# Patient Record
Sex: Male | Born: 1970 | Race: White | Hispanic: No | Marital: Married | Smoking: Former smoker
Health system: Southern US, Community
[De-identification: ages and names within clinical notes are randomized; demographics above are authoritative.]

## PROBLEM LIST (undated history)

## (undated) DIAGNOSIS — G8929 Other chronic pain: Secondary | ICD-10-CM

## (undated) DIAGNOSIS — G35 Multiple sclerosis: Secondary | ICD-10-CM

## (undated) DIAGNOSIS — I10 Essential (primary) hypertension: Secondary | ICD-10-CM

## (undated) DIAGNOSIS — M549 Dorsalgia, unspecified: Secondary | ICD-10-CM

## (undated) HISTORY — DX: Multiple sclerosis: G35

## (undated) HISTORY — DX: Essential (primary) hypertension: I10

## (undated) HISTORY — PX: ARTHROSCOPIC REPAIR ACL: SUR80

## (undated) HISTORY — PX: TOTAL HIP ARTHROPLASTY: SHX124

---

## 2002-05-01 ENCOUNTER — Encounter: Admission: RE | Admit: 2002-05-01 | Discharge: 2002-05-30 | Payer: Self-pay | Admitting: Occupational Medicine

## 2002-06-24 ENCOUNTER — Encounter: Admission: RE | Admit: 2002-06-24 | Discharge: 2002-06-24 | Payer: Self-pay | Admitting: Occupational Medicine

## 2002-06-24 ENCOUNTER — Encounter: Payer: Self-pay | Admitting: Occupational Medicine

## 2003-11-25 ENCOUNTER — Ambulatory Visit (HOSPITAL_COMMUNITY): Admission: RE | Admit: 2003-11-25 | Discharge: 2003-11-25 | Payer: Self-pay | Admitting: Family Medicine

## 2010-02-14 ENCOUNTER — Ambulatory Visit: Payer: Self-pay | Admitting: Family Medicine

## 2010-02-14 DIAGNOSIS — H1030 Unspecified acute conjunctivitis, unspecified eye: Secondary | ICD-10-CM | POA: Insufficient documentation

## 2010-02-14 DIAGNOSIS — I10 Essential (primary) hypertension: Secondary | ICD-10-CM | POA: Insufficient documentation

## 2010-02-15 ENCOUNTER — Telehealth (INDEPENDENT_AMBULATORY_CARE_PROVIDER_SITE_OTHER): Payer: Self-pay | Admitting: *Deleted

## 2010-07-27 NOTE — Letter (Signed)
Summary: Out of Work  [facility]  [address]   [place], [place] [address]   Phone: [phone]  Fax: [phone]    February 14, 2010   Employee:  CHUCK Behrendt    To Whom It May Concern:   Mr. Rossell has a non-contagious form of conjunctivitis.    If you need additional information, please feel free to contact our office.         Sincerely,    Donna Christen MD

## 2010-07-27 NOTE — Assessment & Plan Note (Signed)
Summary: R eye pain x 2 dys rm 4   Vital Signs:  Patient Profile:   40 Years Old Male CC:      R eye pain x 2 dys Height:     71 inches Weight:      239 pounds O2 Sat:      100 % O2 treatment:    Room Air Temp:     98.5 degrees F oral Pulse rate:   58 / minute Pulse rhythm:   regular Resp:     16 per minute BP sitting:   130 / 85  (left arm) Cuff size:   large  Vitals Entered By: Areta Haber CMA (February 14, 2010 4:37 PM)              Vision Screening: Left eye w/o correction: 20 / 20 Right Eye w/o correction: 20 / 30 Both eyes w/o correction:  20/ 20  Color vision testing: normal      Vision Entered By: Areta Haber CMA (February 14, 2010 4:43 PM)    Current Allergies: No known allergies History of Present Illness Chief Complaint: R eye pain x 2 dys History of Present Illness:  Subjective:  Patient complains of pain and redness in his right eye for 2 days.  He believes that it could have been caused by his contact lens. No sinus congestion or earache.  Current Problems: CONJUNCTIVITIS, ACUTE, RIGHT (ICD-372.00) FAMILY HISTORY DIABETES 1ST DEGREE RELATIVE (ICD-V18.0) HYPERTENSION (ICD-401.9)   Current Meds DIOVAN HCT 160-12.5 MG TABS (VALSARTAN-HYDROCHLOROTHIAZIDE) 1 ta b by mouth once daily TOBRADEX 0.3-0.1 % SUSP (TOBRAMYCIN-DEXAMETHASONE) One drop OD Q1 to 2 hr for one day, then 1-2 gtts q4 to 6hr.  REVIEW OF SYSTEMS Constitutional Symptoms      Denies fever, chills, night sweats, weight loss, weight gain, and fatigue.  Eyes       Complains of eye pain.      Denies change in vision, eye discharge, glasses, contact lenses, and eye surgery.      Comments: R x 2 dys Ear/Nose/Throat/Mouth       Denies hearing loss/aids, change in hearing, ear pain, ear discharge, dizziness, frequent runny nose, frequent nose bleeds, sinus problems, sore throat, hoarseness, and tooth pain or bleeding.  Respiratory       Denies dry cough, productive cough, wheezing,  shortness of breath, asthma, bronchitis, and emphysema/COPD.  Cardiovascular       Denies murmurs, chest pain, and tires easily with exhertion.    Gastrointestinal       Denies stomach pain, nausea/vomiting, diarrhea, constipation, blood in bowel movements, and indigestion. Genitourniary       Denies painful urination, kidney stones, and loss of urinary control. Neurological       Denies paralysis, seizures, and fainting/blackouts. Musculoskeletal       Denies muscle pain, joint pain, joint stiffness, decreased range of motion, redness, swelling, muscle weakness, and gout.  Skin       Denies bruising, unusual mles/lumps or sores, and hair/skin or nail changes.  Psych       Denies mood changes, temper/anger issues, anxiety/stress, speech problems, depression, and sleep problems.  Past History:  Past Medical History: Hypertension  Past Surgical History: ACL  Family History: Family History Diabetes 1st degree relative Family History Hypertension  Social History: Married Never Smoked Alcohol use-yes Drug use-no Regular exercise-yes Smoking Status:  never Does Patient Exercise:  yes Drug Use:  no   Objective:  No acute distress  Eyes:  Pupils are  equal, round, and reactive to light and accomdation.  Extraocular movement is intact.   Left conjuctivae normal.  Right conjunctivae injected.  No photophobia.  No lid swelling or tenderness.  No discharge.  Lid eversion right eye reveals no foreign body.  Fluorescein to right eye reveals no uptake. Nose:  Normal septum.  Normal turbinates, mildly congested.   No sinus tenderness present.  Assessment New Problems: CONJUNCTIVITIS, ACUTE, RIGHT (ICD-372.00) FAMILY HISTORY DIABETES 1ST DEGREE RELATIVE (ICD-V18.0) HYPERTENSION (ICD-401.9)   Plan New Medications/Changes: TOBRADEX 0.3-0.1 % SUSP (TOBRAMYCIN-DEXAMETHASONE) One drop OD Q1 to 2 hr for one day, then 1-2 gtts q4 to 6hr.  #2.5cc x 0, 02/14/2010, Donna Christen MD  New  Orders: New Patient Level III [phone] Planning Comments:   Begin Tobradex susp.  Warm compresses to right eye. May take an anlagesic at bedtime.  Given a Water quality scientist patient information and instruction sheet on topic.  Follow-up with ophthalmologist if not improving in 48 hours or if symptoms worsen.   The patient and/or caregiver has been counseled thoroughly with regard to medications prescribed including dosage, schedule, interactions, rationale for use, and possible side effects and they verbalize understanding.  Diagnoses and expected course of recovery discussed and will return if not improved as expected or if the condition worsens. Patient and/or caregiver verbalized understanding.  Prescriptions: TOBRADEX 0.3-0.1 % SUSP (TOBRAMYCIN-DEXAMETHASONE) One drop OD Q1 to 2 hr for one day, then 1-2 gtts q4 to 6hr.  #2.5cc x 0   Entered and Authorized by:   Donna Christen MD   Signed by:   Donna Christen MD on 02/14/2010   Method used:   Print then Give to Patient   RxID:   [id number]   Orders Added: 1)  New Patient Level III [32951]

## 2010-07-27 NOTE — Progress Notes (Signed)
  Phone Note Outgoing Call Call back at [facility] Phone [phone]   Call placed by: Lajean Saver RN,  February 15, 2010 12:08 PM Call placed to: Patient Summary of Call: Call back: No answer. Message left on patient's cell phone with reason of call. Call back with concerns or questions.

## 2012-12-17 DIAGNOSIS — M545 Low back pain, unspecified: Secondary | ICD-10-CM | POA: Insufficient documentation

## 2014-07-29 ENCOUNTER — Encounter: Payer: Self-pay | Admitting: *Deleted

## 2014-07-29 ENCOUNTER — Ambulatory Visit (INDEPENDENT_AMBULATORY_CARE_PROVIDER_SITE_OTHER): Payer: Managed Care, Other (non HMO) | Admitting: Neurology

## 2014-07-29 ENCOUNTER — Encounter: Payer: Self-pay | Admitting: Neurology

## 2014-07-29 VITALS — BP 140/104 | HR 74 | Resp 16 | Ht 71.0 in | Wt 256.6 lb

## 2014-07-29 DIAGNOSIS — M791 Myalgia: Secondary | ICD-10-CM | POA: Diagnosis not present

## 2014-07-29 DIAGNOSIS — M5431 Sciatica, right side: Secondary | ICD-10-CM | POA: Diagnosis not present

## 2014-07-29 DIAGNOSIS — M7918 Myalgia, other site: Secondary | ICD-10-CM

## 2014-07-29 MED ORDER — TRAMADOL HCL 50 MG PO TABS: 50.0000 mg | ORAL_TABLET | Freq: Three times a day (TID) | ORAL | Status: DC | PRN

## 2014-07-29 MED ORDER — CYCLOBENZAPRINE HCL 5 MG PO TABS: 5.0000 mg | ORAL_TABLET | Freq: Three times a day (TID) | ORAL | Status: DC | PRN

## 2014-07-29 MED ORDER — ETODOLAC 400 MG PO TABS: 400.0000 mg | ORAL_TABLET | Freq: Two times a day (BID) | ORAL | Status: DC

## 2014-07-29 NOTE — Progress Notes (Signed)
[facility]  PATIENT: Jake Ware DOB: [birth year]  REFERRING CLINICIAN: Dustin Flock HISTORY FROM: Paitent REASON FOR VISIT: Low back and leg pain   HISTORICAL  CHIEF COMPLAINT:  Chief Complaint  Patient presents with  . Back Pain    Jake Ware is here for eval of lower back pain radiating into right buttock/hip, and occasionally pain in anterior right lower leg, right foot.  Onset 3-4 yrs. ago--when he caught a large patient who was falling.  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  Pain worse with lying/sitting.  Walking, back exercises help. Currently he takes Tizanidine, occaisonal Hydrocodone./fim    HISTORY OF PRESENT ILLNESS:   Jake Ware is a 45 year old man who first began noticing back pain and leg pain in 2013. 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 One day,  He recalls transferring a heavy man and having some difficulty. Later that night he had much worse pain.    Currently, the pain is most intense in the right buttock and hip region. Pain will go a little bit into the leg most of the time and go all the way to the foot when the pain is worse.Marland Kitchen He notes that the pain increases if he sits a while, especially if he is sitting on the right buttock.  Pain also increases if he Valsalva's for urination or defecation.  Pain may get a little bit better if he shifts around.   He notes that there is  The weakness on the right when he has some pain in the leg.    He denies any numbness.   He denies any bladder changes.  For the pain, he takes tizanidine adding twice a day. He also occasionally takes Percocet. He would take Tylenol during the daytime. In 2013, he received a steroid pack. He does not believe he has had any injections into the back or the hip region.    TENS has helped some  He recalls having an MRI of the lumbar spine in 2013 at [facility].. He was told that there were at L4-L5. He did a couple months of physical  therapy. He felt that pain improved at that time. Last year, he did another round of physical therapy and received a benefit again. He also has home exercises that he tries to do on a regular basis.    He is otherwise fairly healthy and has some hypertension.  REVIEW OF SYSTEMS:  Constitutional: No fevers, chills, sweats, or change in appetite Eyes: No visual changes, double vision, eye pain Ear, nose and throat: No hearing loss, ear pain, nasal congestion, sore throat Cardiovascular: No chest pain, palpitations Respiratory:  No shortness of breath at rest or with exertion.   No wheezes GastrointestinaI: No nausea, vomiting, diarrhea, abdominal pain, fecal incontinence Genitourinary:  No dysuria, urinary retention or frequency.  No nocturia. Musculoskeletal:  No neck pain, back pain Integumentary: No rash, pruritus, skin lesions Neurological: as above Psychiatric: No depression at this time.  No anxiety Endocrine: No palpitations, diaphoresis, change in appetite, change in weigh or increased thirst Hematologic/Lymphatic:  No anemia, purpura, petechiae. Allergic/Immunologic: No itchy/runny eyes, nasal congestion, recent allergic reactions, rashes  ALLERGIES: Not on File  HOME MEDICATIONS:  Current outpatient prescriptions:  .  oxyCODONE-acetaminophen (PERCOCET) 7.5-325 MG per tablet, Take 1 tablet by mouth every 8 (eight) hours as needed. for pain, Disp: , Rfl: 0 .  tiZANidine (ZANAFLEX) 4 MG tablet, , Disp: , Rfl:  1 .  valsartan-hydrochlorothiazide (DIOVAN-HCT) 160-12.5 MG per tablet, Take 1 tablet by mouth daily., Disp: , Rfl: 3   PAST MEDICAL HISTORY: Past Medical History  Diagnosis Date  . Hypertension   . Multiple sclerosis     PAST SURGICAL HISTORY: Past Surgical History  Procedure Laterality Date  . Arthroscopic repair acl Left     FAMILY HISTORY: Family History  Problem Relation Age of Onset  . Healthy Mother   . Congestive Heart Failure Father   . Diabetes  Father   . Pulmonary Hypertension Father   . Kidney failure Father     SOCIAL HISTORY:  History   Social History  . Marital Status: Married    Spouse Name: N/A    Number of Children: N/A  . Years of Education: N/A   Occupational History  . Not on file.   Social History Main Topics  . Smoking status: Former Games developermoker  . Smokeless tobacco: Not on file  . Alcohol Use: 0.0 oz/week    0 Not specified per week     Comment: occasional  . Drug Use: No  . Sexual Activity: Not on file   Other Topics Concern  . Not on file   Social History Narrative  . No narrative on file     PHYSICAL EXAM  Filed Vitals:   07/29/14 1333  BP: 140/104  Pulse: 74  Resp: 16  Height: 5\' 11"  (1.803 m)  Weight: 256 lb 9.6 oz (116.393 kg)    Body mass index is 35.8 kg/(m^2).   General: The patient is well-developed and well-nourished and in no acute distress  Neck: The neck is supple, no carotid bruits are noted.  The neck is nontender.  Skin: Extremities are without significant edema.  Musculoskeletal:  Tender over the right piriformis muscle.  Nontender over trochanteric bursa and lumbar paraspinal muscles  Neurologic Exam  Mental status: The patient is alert and oriented x 3 at the time of the examination. The patient has apparent normal recent and remote memory, with an apparently normal attention span and concentration ability.   Speech is normal.  Cranial nerves: Extraocular movements are full. Pupils are equal, round, and reactive to light and accomodation.  Visual fields are full.  Facial symmetry is present. There is good facial sensation to soft touch bilaterally.Facial strength is normal.  Trapezius and sternocleidomastoid strength is normal. No dysarthria is noted.  The tongue is midline, and the patient has symmetric elevation of the soft palate. No obvious hearing deficits are noted.  Motor:  Muscle bulk and tone are normal. Strength is  5 / 5 in all 4 extremities.   Sensory:  Sensory testing is intact to  Touch and  Vibration on all 4 extremities.  Coordination: Cerebellar testing reveals good finger-nose-finger and heel-to-shin bilaterally.  Gait and station: Station and gait are normal. Tandem gait is normal. Romberg is negative.   Reflexes: Deep tendon reflexes are symmetric and normal bilaterally. Plantar responses are normal.    DIAGNOSTIC DATA (LABS, IMAGING, TESTING) - I reviewed patient records, labs, notes, testing and imaging myself where available.     ASSESSMENT AND PLAN  Sciatica, right  Right buttock pain  In summary,Mr. Ophelia CharterYates is a 44 year old man with right buttock pain some radiation down the right leg. From his examination, I cannot tell if his symptoms are due to a sciatica from a piriformis syndrome or if they are due to a lumbar radiculopathy.for now, I will change some of his medications around. Specifically  we will switch the Brantley Fling to cyclobenzaprine and add etodolac and tramadol.  Additionally, I will do a trigger point injection into the right piriformis muscle with 80 mg of Depo Medrol in 5 mL Marcaine. He tolerated the procedure well.  If he is not better in a week, he will let us know. If that occurs, we will check an MRI of the lumbar spine to see if his pain might better be treated with epidural steroids or other procedures.  If he is better he will follow up as needed and he is advised to call if he has flareups in pain. I also gave him a note to return to work if he is feeling better.   Seymore Brodowski A. Epimenio Foot, MD, PhD 07/29/2014, 1:59 PM Certified in Neurology, Clinical Neurophysiology, Sleep Medicine, Pain Medicine and Neuroimaging  [facility] [address], [address] [place], [place] [address] [phone]

## 2014-08-04 ENCOUNTER — Telehealth: Payer: Self-pay | Admitting: Neurology

## 2014-08-04 NOTE — Telephone Encounter (Signed)
Spoke with Jake Ware and per RAS, rec. MRI L-spine without contrast since meds have not helped pain.  He sts. He was not able to get meds filled until Wednesday, then didn't start them until Saturday d/t virus.  He sts he would like to try them for a few more days and then let me know if he still needs mri.  I advised this is ok--he will call me back on Monday.  He also sts he is not able to return to work due to pain, requesting work note to return on Monday 08-11-14.  I advised will address this on Monday/fim

## 2014-08-04 NOTE — Telephone Encounter (Signed)
Patient is calling in regard to Rx Tramadol 50 mg, Flexeril 5 mg, and Lodine 400 mg.  Patient tried these last week but  symptoms are not better.  He is not sure if he has given these a fair amount of time since he was sick a couple of days last week. Should he continue to take these Rx or should the test you suggested be a better route.  Please call.

## 2014-08-07 ENCOUNTER — Encounter: Payer: Self-pay | Admitting: *Deleted

## 2014-08-07 ENCOUNTER — Telehealth: Payer: Self-pay | Admitting: Neurology

## 2014-08-07 NOTE — Telephone Encounter (Signed)
Patient is calling to get a note to return to work on Monday 08-11-14. Please fax note to [phone]. Thank you.

## 2014-08-07 NOTE — Telephone Encounter (Signed)
Work note allowing Jake Ware to return to work on Monday, 08-11-14 faxed to [phone] per his request/fim

## 2016-07-05 ENCOUNTER — Emergency Department (HOSPITAL_BASED_OUTPATIENT_CLINIC_OR_DEPARTMENT_OTHER)
Admission: EM | Admit: 2016-07-05 | Discharge: 2016-07-05 | Disposition: A | Discharge status: Home / Self Care | Payer: No Typology Code available for payment source | Attending: Emergency Medicine | Admitting: Emergency Medicine

## 2016-07-05 ENCOUNTER — Emergency Department (HOSPITAL_BASED_OUTPATIENT_CLINIC_OR_DEPARTMENT_OTHER): Payer: No Typology Code available for payment source

## 2016-07-05 ENCOUNTER — Encounter (HOSPITAL_BASED_OUTPATIENT_CLINIC_OR_DEPARTMENT_OTHER): Payer: Self-pay

## 2016-07-05 DIAGNOSIS — Y999 Unspecified external cause status: Secondary | ICD-10-CM | POA: Diagnosis not present

## 2016-07-05 DIAGNOSIS — R072 Precordial pain: Secondary | ICD-10-CM | POA: Insufficient documentation

## 2016-07-05 DIAGNOSIS — Y92410 Unspecified street and highway as the place of occurrence of the external cause: Secondary | ICD-10-CM | POA: Insufficient documentation

## 2016-07-05 DIAGNOSIS — I10 Essential (primary) hypertension: Secondary | ICD-10-CM | POA: Insufficient documentation

## 2016-07-05 DIAGNOSIS — Z79899 Other long term (current) drug therapy: Secondary | ICD-10-CM | POA: Diagnosis not present

## 2016-07-05 DIAGNOSIS — S299XXA Unspecified injury of thorax, initial encounter: Secondary | ICD-10-CM | POA: Diagnosis present

## 2016-07-05 DIAGNOSIS — M549 Dorsalgia, unspecified: Secondary | ICD-10-CM | POA: Diagnosis not present

## 2016-07-05 DIAGNOSIS — R1013 Epigastric pain: Secondary | ICD-10-CM | POA: Diagnosis not present

## 2016-07-05 DIAGNOSIS — G8929 Other chronic pain: Secondary | ICD-10-CM | POA: Diagnosis not present

## 2016-07-05 DIAGNOSIS — Y939 Activity, unspecified: Secondary | ICD-10-CM | POA: Insufficient documentation

## 2016-07-05 DIAGNOSIS — Z87891 Personal history of nicotine dependence: Secondary | ICD-10-CM | POA: Insufficient documentation

## 2016-07-05 HISTORY — DX: Dorsalgia, unspecified: M54.9

## 2016-07-05 HISTORY — DX: Other chronic pain: G89.29

## 2016-07-05 MED ORDER — MORPHINE SULFATE 15 MG PO TABS: 15.0000 mg | ORAL_TABLET | ORAL | 0 refills | Status: AC | PRN

## 2016-07-05 MED ORDER — OXYCODONE HCL 5 MG PO TABS
5.0000 mg | ORAL_TABLET | Freq: Once | ORAL | Status: AC
Start: 2016-07-05 — End: 2016-07-05
  Administered 2016-07-05: 5 mg via ORAL
  Filled 2016-07-05: qty 1

## 2016-07-05 MED ORDER — ACETAMINOPHEN 500 MG PO TABS
1000.0000 mg | ORAL_TABLET | Freq: Once | ORAL | Status: AC
  Administered 2016-07-05: 1000 mg via ORAL
  Filled 2016-07-05: qty 2

## 2016-07-05 MED ORDER — IBUPROFEN 800 MG PO TABS
800.0000 mg | ORAL_TABLET | Freq: Once | ORAL | Status: AC
Start: 2016-07-05 — End: 2016-07-05
  Administered 2016-07-05: 800 mg via ORAL
  Filled 2016-07-05: qty 1
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
## 2016-07-05 NOTE — ED Notes (Signed)
Discussed case with Ardine BjorkW Dansie PA-orders received
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
## 2016-07-05 NOTE — Discharge Instructions (Signed)

## 2018-04-13 ENCOUNTER — Other Ambulatory Visit: Payer: Self-pay | Admitting: Physical Medicine and Rehabilitation

## 2018-04-13 DIAGNOSIS — M5126 Other intervertebral disc displacement, lumbar region: Secondary | ICD-10-CM

## 2018-04-22 ENCOUNTER — Ambulatory Visit
Admission: RE | Admit: 2018-04-22 | Discharge: 2018-04-22 | Disposition: A | Discharge status: Home / Self Care | Payer: 59 | Source: Ambulatory Visit | Attending: Physical Medicine and Rehabilitation | Admitting: Physical Medicine and Rehabilitation

## 2018-04-22 DIAGNOSIS — M5126 Other intervertebral disc displacement, lumbar region: Secondary | ICD-10-CM

## 2020-07-08 IMAGING — MR MR LUMBAR SPINE W/O CM
4 of 5 series · 26 of 48 positions shown · non-contrast
Comparison: Lumbar spine MRI 10/13/2015

CLINICAL DATA: Chronic low back pain radiating to the right lower
extremity.

EXAM:
MRI LUMBAR SPINE WITHOUT CONTRAST
TECHNIQUE: Multiplanar, multisequence MR imaging of the lumbar spine was
performed. No intravenous contrast was administered.

[Series 4: T2 · sagittal · 4.0mm · 0.55mm/px · 6 of 12 slices shown (1 of 2)]
[im 1/12]
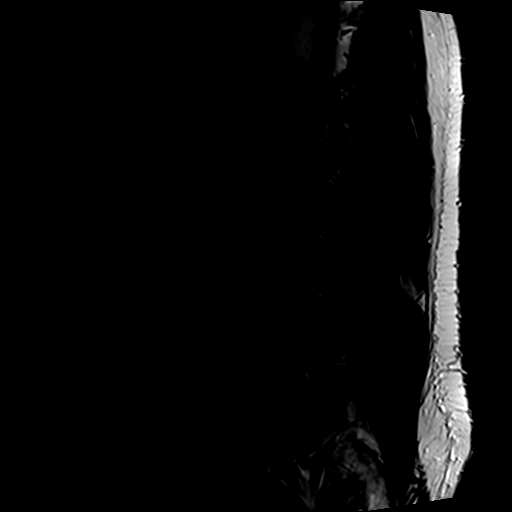
[im 3/12]
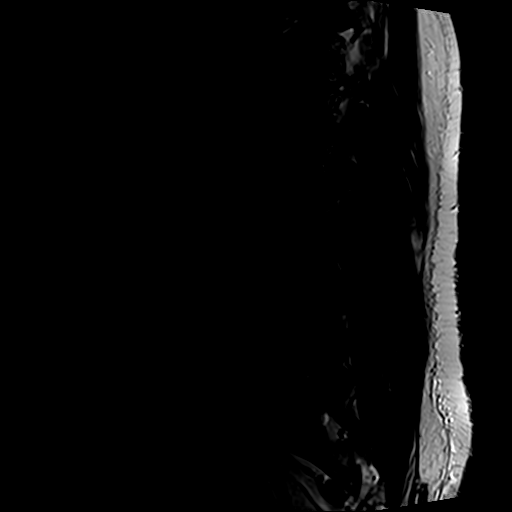
[im 5/12]
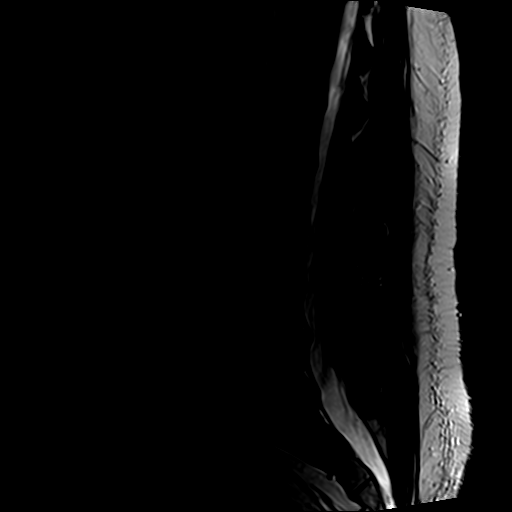
[im 7/12]
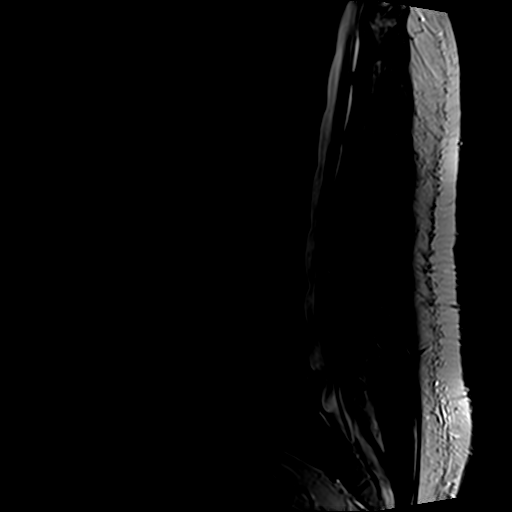
[im 9/12]
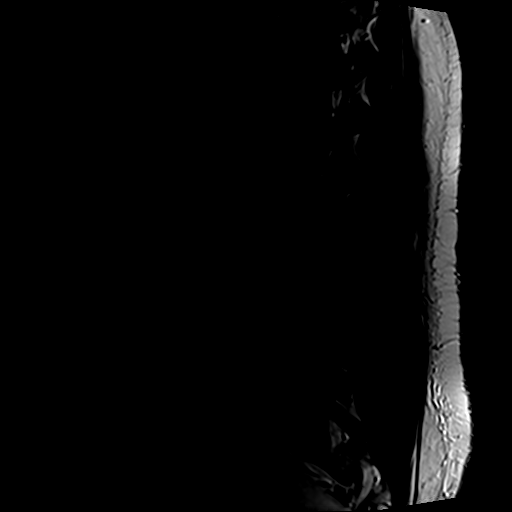
[im 12/12]
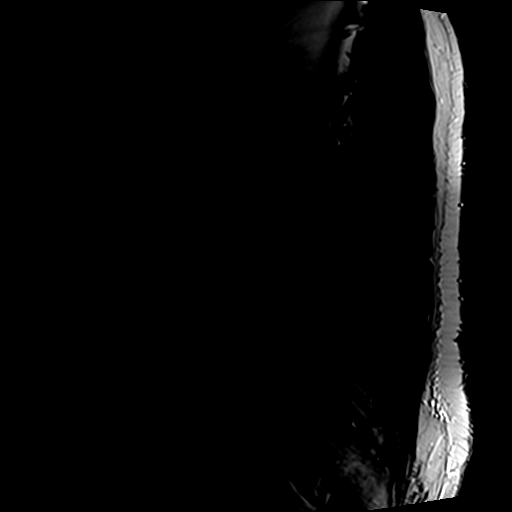

[Series 5: T1 · sagittal · 4.0mm · 0.55mm/px · 5 of 12 slices shown (1 of 2)]
[im 1/12]
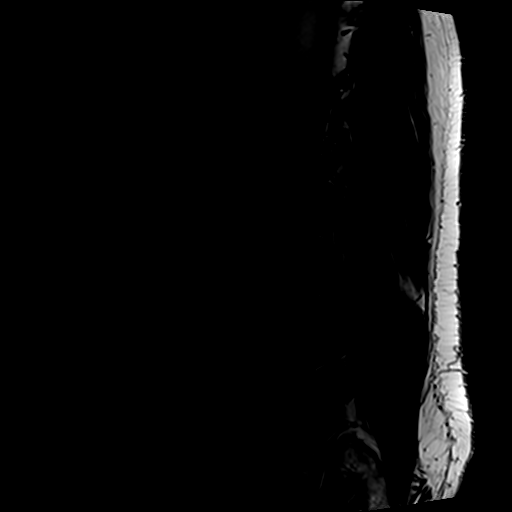
[im 3/12]
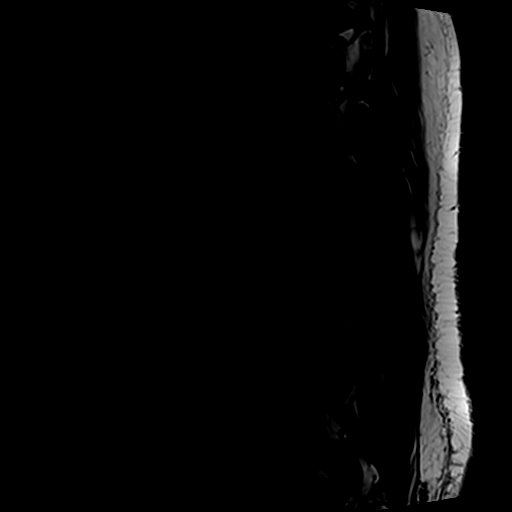
[im 6/12]
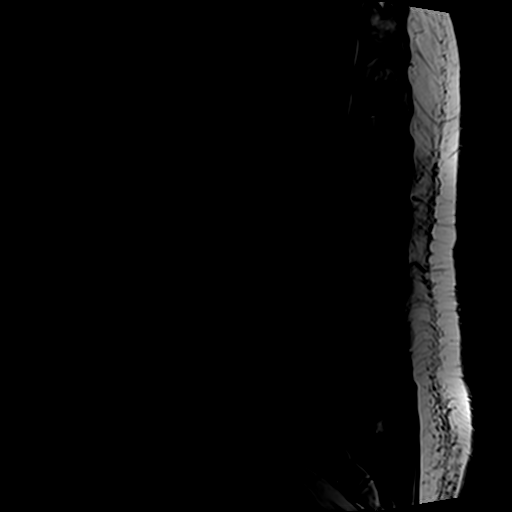
[im 9/12]
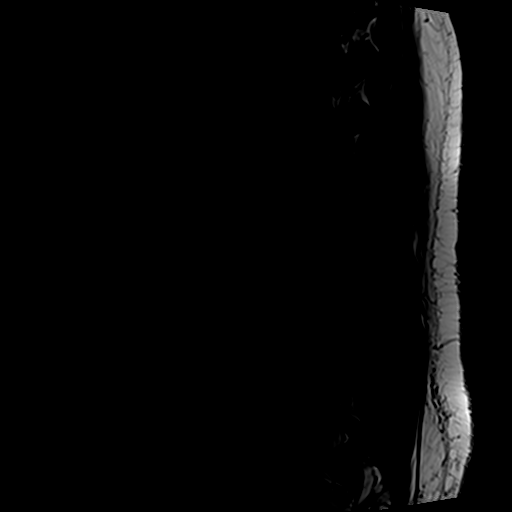
[im 12/12]
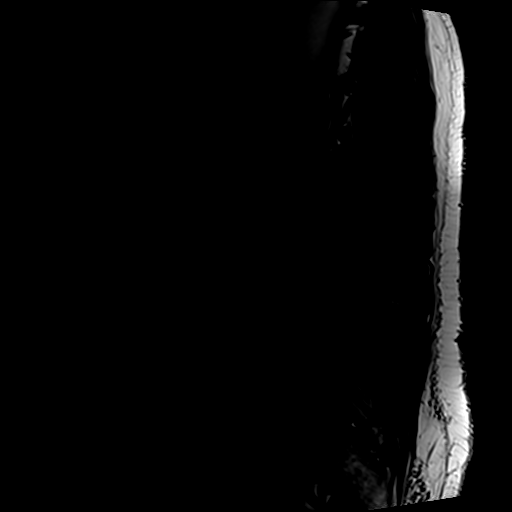

[Series 7: T2 · axial · 4.0mm · 0.70mm/px · z∈[-59,+147]mm · 10 of 39 slices shown (2 of 2)]
[im 3/39]
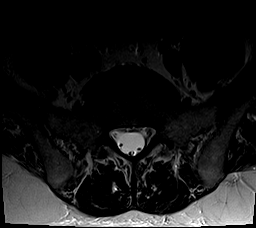
[im 6/39]
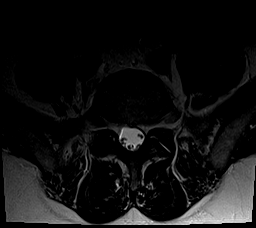
[im 8/39]
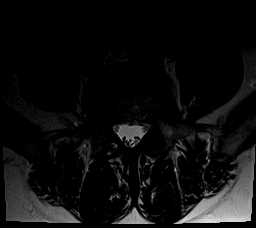
[im 13/39]
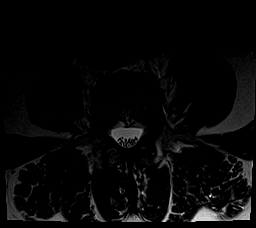
[im 18/39]
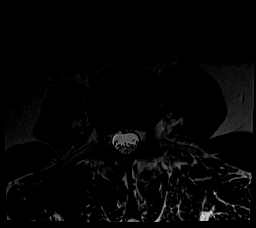
[im 21/39]
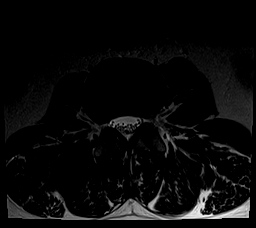
[im 23/39]
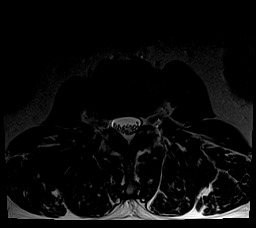
[im 28/39]
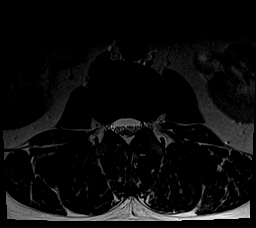
[im 33/39]
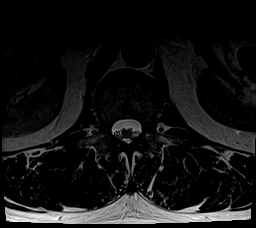
[im 39/39]
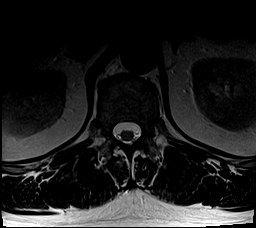

[Series 8: T1 · axial · 4.0mm · 0.35mm/px · z∈[-59,+117]mm · 5 of 39 slices shown (2 of 2)]
[im 3/39]
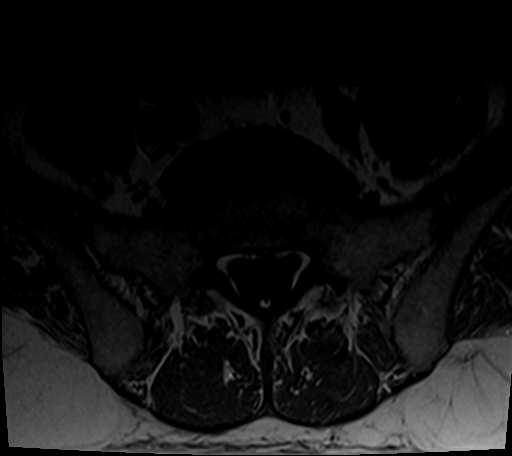
[im 6/39]
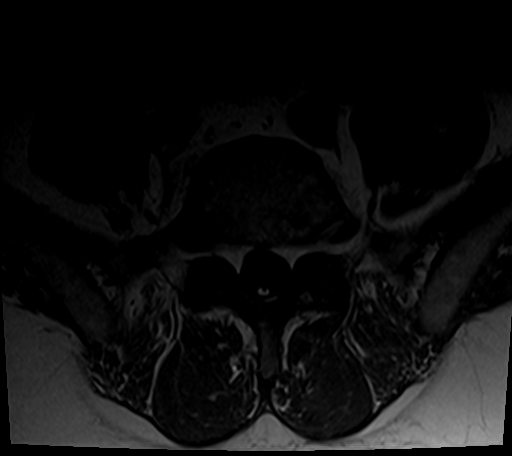
[im 8/39]
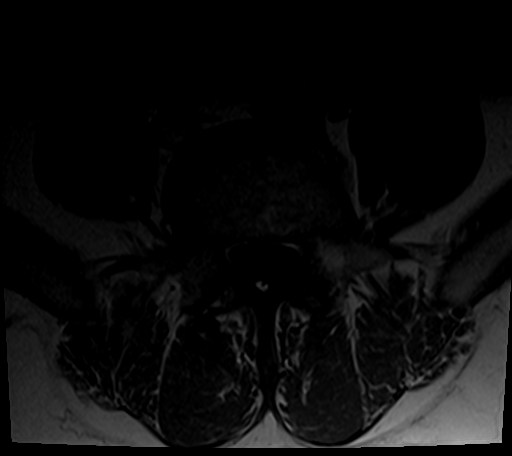
[im 21/39]
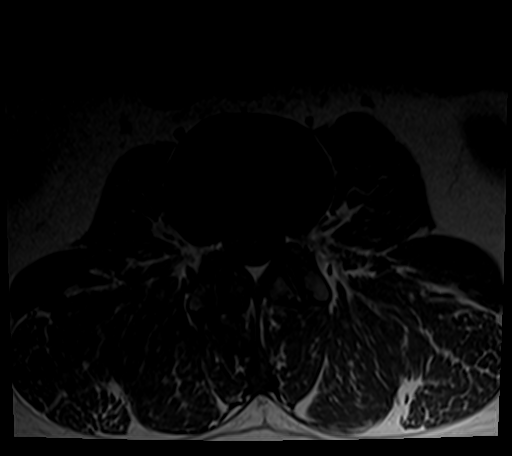
[im 33/39]
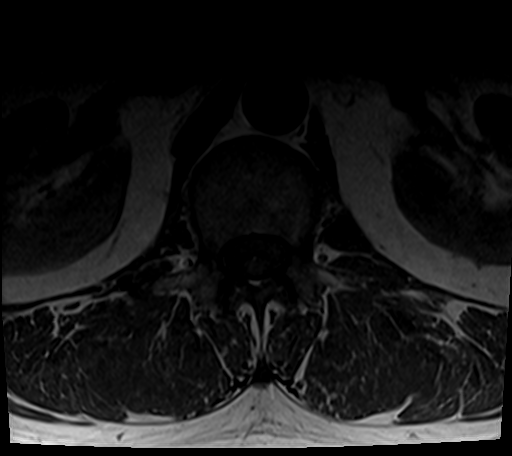

[26 of 48 positions shown; findings below may reference images not displayed]

FINDINGS: Segmentation: Normal. The lowest disc space is considered to be
L5-S1.

Alignment:  Normal

Vertebrae: No acute compression fracture, discitis-osteomyelitis of
focal marrow lesion.

Conus medullaris and cauda equina: The conus medullaris terminates
at the L2 level. There is a small distal filum terminale lipoma.

Paraspinal and other soft tissues: The visualized aorta, IVC and
iliac vessels are normal. The visualized retroperitoneal organs and
paraspinal soft tissues are normal.

Disc levels: Sagittal plane imaging includes the T11-12 disc level
through the upper sacrum, with axial imaging of the T12-L1 to L5-S1
disc levels.

T11-12: Normal.

T12-L1: Normal.

L1-2: Normal.

L2-3: Small right eccentric disc bulge without spinal canal or
neural foraminal stenosis.

L3-4: Disc desiccation with mild bulging. Endplate ridging. Mild
left neural foraminal stenosis, progressed from prior.

L4-5: Disc space narrowing with left eccentric bulge, increased from
the prior study. Mild left neural foraminal stenosis.

L5-S1: Small disc bulge with endplate ridging. Mild bilateral
foraminal stenosis progressed from prior.

The visualized portion of the sacrum is normal.
IMPRESSION: 1. Progression of mild lower lumbar degenerative disc disease with
mild left L3, left L4 and bilateral L5 neural foraminal stenosis.
2. No lumbar spinal canal stenosis.
3. Unchanged, incidentally noted filum terminale lipoma.

## 2021-03-17 LAB — EXTERNAL GENERIC LAB PROCEDURE: COLOGUARD: NEGATIVE

## 2023-08-07 ENCOUNTER — Encounter (HOSPITAL_BASED_OUTPATIENT_CLINIC_OR_DEPARTMENT_OTHER): Payer: Self-pay | Admitting: *Deleted

## 2023-08-07 ENCOUNTER — Other Ambulatory Visit (HOSPITAL_BASED_OUTPATIENT_CLINIC_OR_DEPARTMENT_OTHER): Payer: Self-pay

## 2023-08-07 ENCOUNTER — Emergency Department (HOSPITAL_BASED_OUTPATIENT_CLINIC_OR_DEPARTMENT_OTHER)
Admission: EM | Admit: 2023-08-07 | Discharge: 2023-08-07 | Disposition: A | Discharge status: Home / Self Care | Payer: 59 | Attending: Emergency Medicine | Admitting: Emergency Medicine

## 2023-08-07 ENCOUNTER — Other Ambulatory Visit: Payer: Self-pay

## 2023-08-07 DIAGNOSIS — J069 Acute upper respiratory infection, unspecified: Secondary | ICD-10-CM | POA: Diagnosis not present

## 2023-08-07 DIAGNOSIS — Z20822 Contact with and (suspected) exposure to covid-19: Secondary | ICD-10-CM | POA: Insufficient documentation

## 2023-08-07 DIAGNOSIS — R059 Cough, unspecified: Secondary | ICD-10-CM | POA: Diagnosis present

## 2023-08-07 LAB — RESP PANEL BY RT-PCR (RSV, FLU A&B, COVID)  RVPGX2
Influenza A by PCR: POSITIVE — AB
Influenza B by PCR: NEGATIVE
Resp Syncytial Virus by PCR: NEGATIVE
SARS Coronavirus 2 by RT PCR: NEGATIVE

## 2023-08-07 LAB — GROUP A STREP BY PCR: Group A Strep by PCR: NOT DETECTED

## 2023-08-07 MED ORDER — KETOROLAC TROMETHAMINE 15 MG/ML IJ SOLN
15.0000 mg | Freq: Once | INTRAMUSCULAR | Status: AC
  Administered 2023-08-07: 15 mg via INTRAVENOUS
  Filled 2023-08-07: qty 1

## 2023-08-07 MED ORDER — BENZONATATE 100 MG PO CAPS
100.0000 mg | ORAL_CAPSULE | Freq: Three times a day (TID) | ORAL | 0 refills | Status: AC
  Filled 2023-08-07: qty 21, 7d supply, fill #0

## 2023-08-07 MED ORDER — AMOXICILLIN-POT CLAVULANATE 875-125 MG PO TABS
1.0000 | ORAL_TABLET | Freq: Two times a day (BID) | ORAL | 0 refills | Status: AC
  Filled 2023-08-07: qty 14, 7d supply, fill #0

## 2023-08-07 MED ORDER — ONDANSETRON 4 MG PO TBDP
4.0000 mg | ORAL_TABLET | ORAL | 0 refills | Status: AC | PRN
  Filled 2023-08-07: qty 20, 4d supply, fill #0

## 2023-08-07 NOTE — ED Provider Notes (Signed)
 [place] [facility] Provider Note   CSN: [id number] Arrival date & time: 08/07/23  4098     History  Chief Complaint  Patient presents with   Chest Pain    Jake Ware is a 53 y.o. male.  53 yo M with a cc of cough congestion fever chills myalgias sensation of difficulty breathing like his heart is beating harder than it usually does.  This been going on for a few days now.  He works at a nursing home.  Denies sick contacts otherwise.  Denies history of smoking or asthma.  Is having a bit of a headache 2.  Feels that right behind eyes.   Chest Pain      Home Medications Prior to Admission medications   Medication Sig Start Date End Date Taking? Authorizing Provider  amoxicillin -clavulanate (AUGMENTIN ) 875-125 MG tablet Take 1 tablet by mouth every 12 (twelve) hours. 08/07/23  Yes Albertus Hughs, DO  benzonatate  (TESSALON ) 100 MG capsule Take 1 capsule (100 mg total) by mouth every 8 (eight) hours. 08/07/23  Yes Albertus Hughs, DO  ondansetron  (ZOFRAN -ODT) 4 MG disintegrating tablet 4mg  ODT q4 hours prn nausea/vomit 08/07/23  Yes Christabell Loseke, DO  escitalopram (LEXAPRO) 10 MG tablet Take 10 mg by mouth daily.    [provider]  METOPROLOL TARTRATE PO Take by mouth.    [provider]  morphine  (MSIR) 15 MG tablet Take 1 tablet (15 mg total) by mouth every 4 (four) hours as needed for severe pain. 07/05/16   Albertus Hughs, DO  oxyCODONE -acetaminophen  (PERCOCET) 7.5-325 MG per tablet Take 1 tablet by mouth every 8 (eight) hours as needed. for pain 07/02/14   [provider]  tiZANidine (ZANAFLEX) 4 MG tablet  07/01/14   [provider]  valsartan-hydrochlorothiazide (DIOVAN-HCT) 160-12.5 MG per tablet Take 1 tablet by mouth daily. 05/15/14   [provider]      Allergies    Patient has no known allergies.    Review of Systems   Review of Systems  Cardiovascular:  Positive for chest pain.    Physical  Exam Updated Vital Signs BP (!) 152/85 (BP Location: Right Arm)   Pulse 100   Temp 100.2 F (37.9 C) (Oral)   Resp (!) 22   Ht 5\' 11"  (1.803 m)   Wt 115.7 kg   SpO2 91%   BMI 35.57 kg/m  Physical Exam Vitals and nursing note reviewed.  Constitutional:      Appearance: He is well-developed.  HENT:     Head: Normocephalic and atraumatic.     Comments: Swollen turbinates, posterior nasal drip, diffuse sinus tenderness to percussion, tm normal bilaterally.   Eyes:     Pupils: Pupils are equal, round, and reactive to light.  Neck:     Vascular: No JVD.  Cardiovascular:     Rate and Rhythm: Normal rate and regular rhythm.     Heart sounds: No murmur heard.    No friction rub. No gallop.  Pulmonary:     Effort: No respiratory distress.     Breath sounds: No wheezing.  Abdominal:     General: There is no distension.     Tenderness: There is no abdominal tenderness. There is no guarding or rebound.  Musculoskeletal:        General: Normal range of motion.     Cervical back: Normal range of motion and neck supple.  Skin:    Coloration: Skin is not pale.  Findings: No rash.  Neurological:     Mental Status: He is alert and oriented to person, place, and time.  Psychiatric:        Behavior: Behavior normal.     ED Results / Procedures / Treatments   Labs (all labs ordered are listed, but only abnormal results are displayed) Labs Reviewed  RESP PANEL BY RT-PCR (RSV, FLU A&B, COVID)  [id number]  GROUP A STREP BY PCR    EKG EKG Interpretation Date/Time:  Monday August 07 2023 06:47:08 EST Ventricular Rate:  88 PR Interval:  138 QRS Duration:  86 QT Interval:  333 QTC Calculation: 403 R Axis:   39  Text Interpretation: Sinus rhythm Baseline wander in lead(s) V2 Confirmed by Maralee Senate, April ([id number]) on 08/07/2023 6:49:35 AM  Radiology No results found.  Procedures Procedures    Medications Ordered in ED Medications  ketorolac  (TORADOL ) 15 MG/ML injection 15 mg  (has no administration in time range)    ED Course/ Medical Decision Making/ A&P                                 Medical Decision Making Risk Prescription drug management.   53 yo M with a chief complaints of cough congestion fever chills myalgias headache a sensation like his heart is beating harder than it normally does.  The patient is well-appearing and nontoxic.  Appears well-hydrated.  Clear lung sounds for me on exam.  No tachypnea.  Oxygen saturation is a bit on the lower side.  He denies any history of asthma or smoking.  He does have some signs and symptoms consistent with sinusitis.  I will start him on antibiotics.  Treat supportively otherwise.  PCP follow-up.  7:30 AM:  I have discussed the diagnosis/risks/treatment options with the patient.  Evaluation and diagnostic testing in the emergency department does not suggest an emergent condition requiring admission or immediate intervention beyond what has been performed at this time.  They will follow up with PCP. We also discussed returning to the ED immediately if new or worsening sx occur. We discussed the sx which are most concerning (e.g., sudden worsening pain, fever, inability to tolerate by mouth) that necessitate immediate return. Medications administered to the patient during their visit and any new prescriptions provided to the patient are listed below.  Medications given during this visit Medications  ketorolac  (TORADOL ) 15 MG/ML injection 15 mg (has no administration in time range)     The patient appears reasonably screen and/or stabilized for discharge and I doubt any other medical condition or other [facility] requiring further screening, evaluation, or treatment in the ED at this time prior to discharge.          Final Clinical Impression(s) / ED Diagnoses Final diagnoses:  Viral URI with cough    Rx / DC Orders ED Discharge Orders          Ordered    benzonatate  (TESSALON ) 100 MG capsule  Every 8 hours         08/07/23 0725    ondansetron  (ZOFRAN -ODT) 4 MG disintegrating tablet        08/07/23 0725    amoxicillin -clavulanate (AUGMENTIN ) 875-125 MG tablet  Every 12 hours        08/07/23 0725              Ahmiyah Coil, DO 08/07/23 0730

## 2023-08-07 NOTE — ED Triage Notes (Addendum)
 Pt presents with c/o shortness of breath, chest pain, sore throat , coughing,congestion.- Non productive cough. Started Saturday. Woke up with headache 10/10.  And chest pain 5/10.  Works in health care- sick contacts. Taking mucinex , theraflu OTC.  mild nausea with coughing denies vomiting , abd pain.

## 2023-08-07 NOTE — Discharge Instructions (Signed)
 Take tylenol 2 pills 4 times a day and motrin 4 pills 3 times a day.  Drink plenty of fluids.  Return for worsening shortness of breath, headache, confusion. Follow up with your family doctor.
